# Patient Record
Sex: Male | Born: 1970 | Race: White | Hispanic: No | Marital: Married | Smoking: Never smoker
Health system: Southern US, Community
[De-identification: ages and names within clinical notes are randomized; demographics above are authoritative.]

## PROBLEM LIST (undated history)

## (undated) DIAGNOSIS — K589 Irritable bowel syndrome without diarrhea: Secondary | ICD-10-CM

## (undated) DIAGNOSIS — R519 Headache, unspecified: Secondary | ICD-10-CM

## (undated) DIAGNOSIS — R51 Headache: Secondary | ICD-10-CM

## (undated) HISTORY — PX: STOMACH SURGERY: SHX791

## (undated) HISTORY — DX: Headache: R51

## (undated) HISTORY — DX: Headache, unspecified: R51.9

## (undated) HISTORY — DX: Irritable bowel syndrome, unspecified: K58.9

---

## 2008-01-15 ENCOUNTER — Encounter: Admission: RE | Admit: 2008-01-15 | Discharge: 2008-01-15 | Payer: Self-pay | Admitting: Gastroenterology

## 2008-02-26 ENCOUNTER — Encounter: Admission: RE | Admit: 2008-02-26 | Discharge: 2008-02-26 | Payer: Self-pay | Admitting: Gastroenterology

## 2008-04-20 ENCOUNTER — Encounter: Admission: RE | Admit: 2008-04-20 | Discharge: 2008-04-20 | Payer: Self-pay | Admitting: Gastroenterology

## 2010-03-11 ENCOUNTER — Encounter: Payer: Self-pay | Admitting: Gastroenterology

## 2010-10-09 IMAGING — RF DG UGI W/ HIGH DENSITY W/KUB
19 of 24 series · 19 of 24 positions shown · non-contrast
Comparison: None

CLINICAL DATA: Epigastric pain.  Vomiting.  History of surgery for
pyloric stenosis as an infant.

UPPER GI SERIES W/HIGH DENSITY W/KUB
TECHNIQUE: After obtaining a scout radiograph, upper GI series
performed with high density barium and effervescent agent. Thin
barium also used.
Fluoroscopy Time: 4.3 minutes

[Series 1: run · 1 of 1 slices shown (1 of 19)]
[im 1/1]
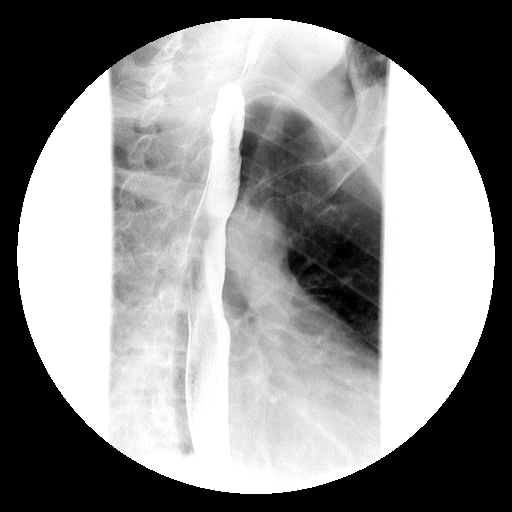

[Series 2: run · 1 of 1 slices shown (2 of 19)]
[im 1/1]
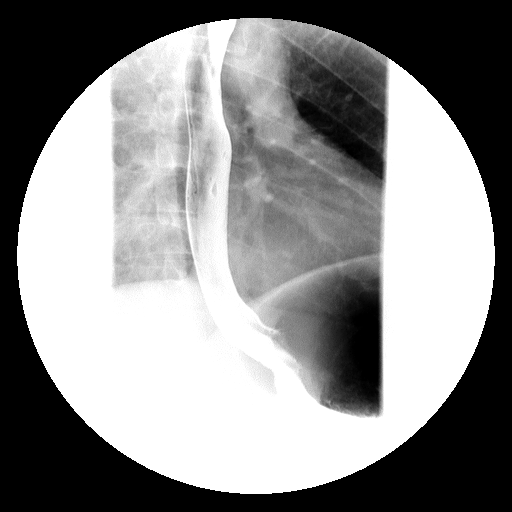

[Series 4: run · 1 of 1 slices shown (3 of 19)]
[im 1/1]
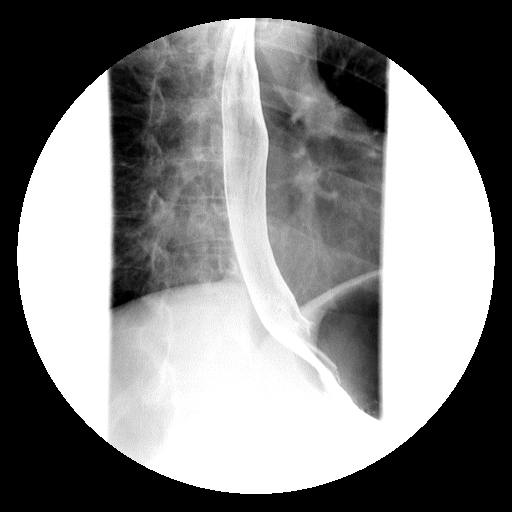

[Series 5: run · 1 of 1 slices shown (4 of 19)]
[im 1/1]
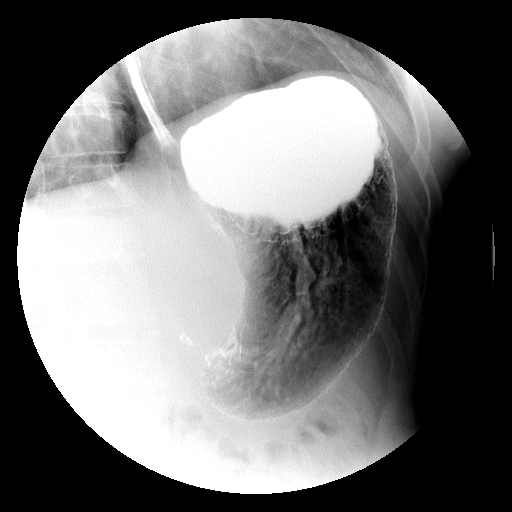

[Series 6: run · 1 of 1 slices shown (5 of 19)]
[im 1/1]
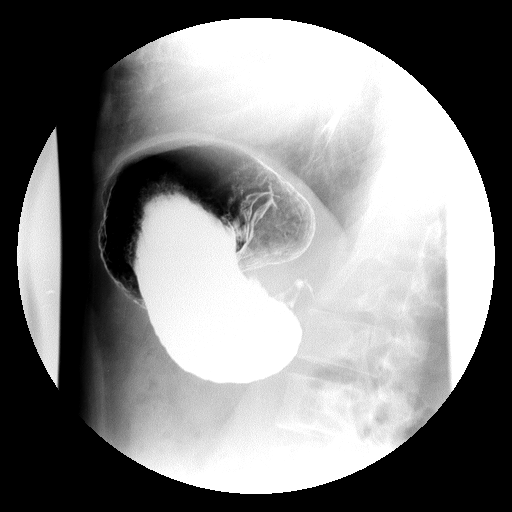

[Series 7: run · 1 of 1 slices shown (6 of 19)]
[im 1/1]
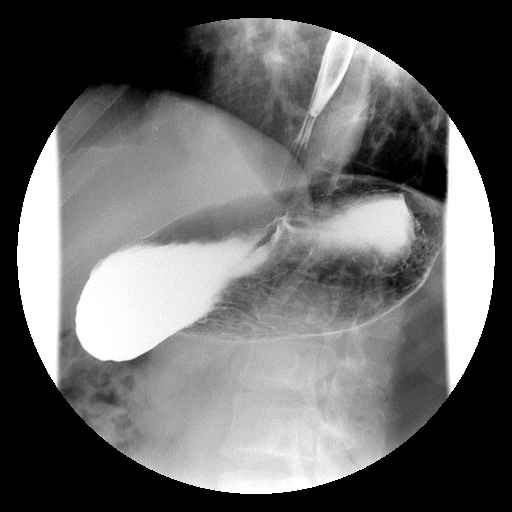

[Series 9: run · 1 of 1 slices shown (7 of 19)]
[im 1/1]
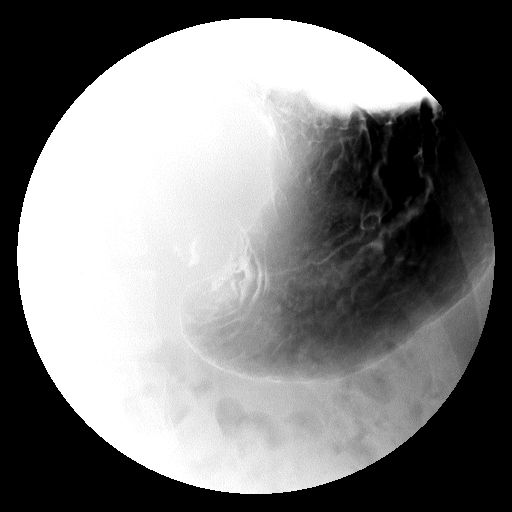

[Series 10: run · 1 of 1 slices shown (8 of 19)]
[im 1/1]
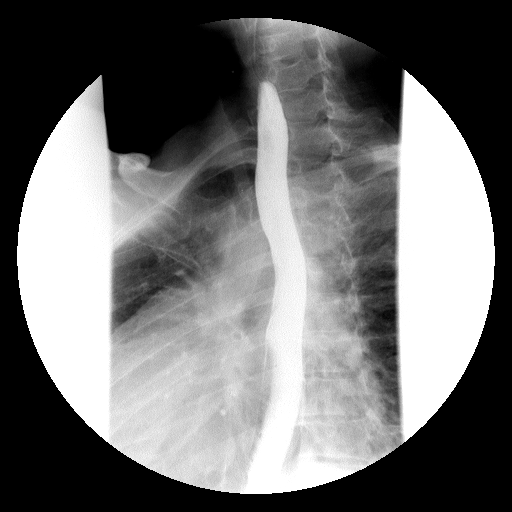

[Series 11: run · 1 of 1 slices shown (9 of 19)]
[im 1/1]
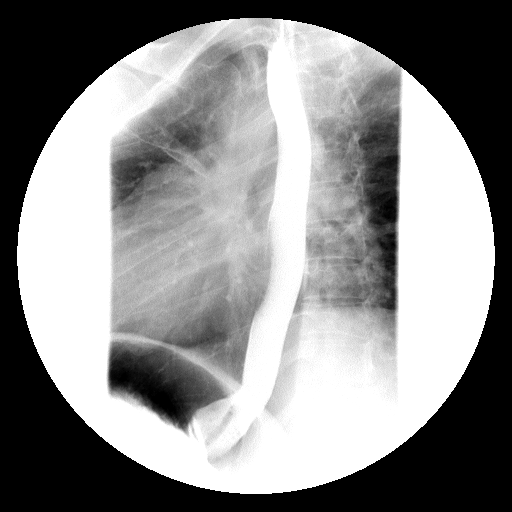

[Series 13: run · 1 of 1 slices shown (10 of 19)]
[im 1/1]
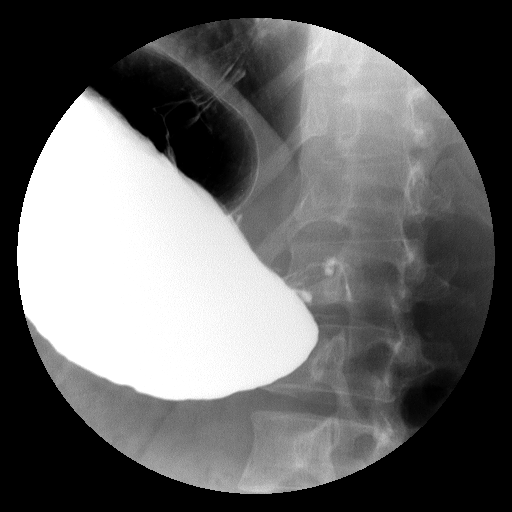

[Series 14: run · 1 of 1 slices shown (11 of 19)]
[im 1/1]
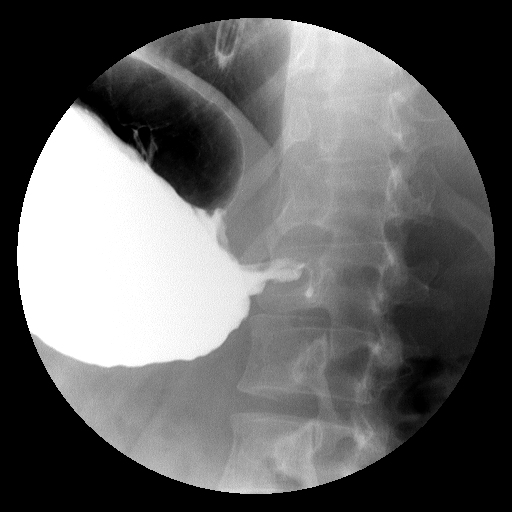

[Series 15: run · 1 of 1 slices shown (12 of 19)]
[im 1/1]
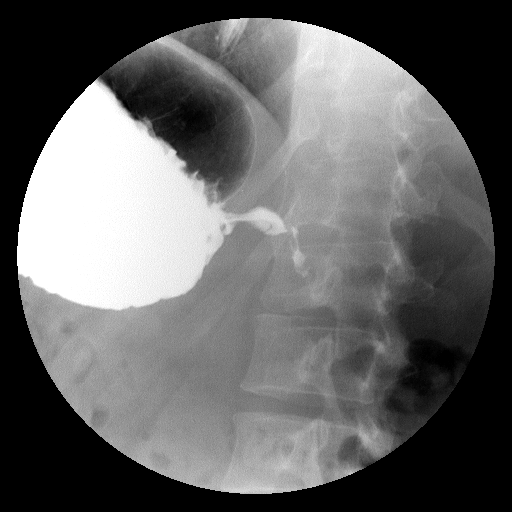

[Series 16: run · 1 of 1 slices shown (13 of 19)]
[im 1/1]
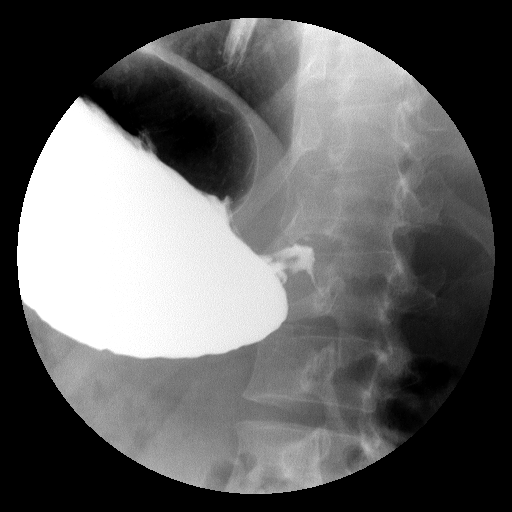

[Series 18: run · 1 of 1 slices shown (14 of 19)]
[im 1/1]
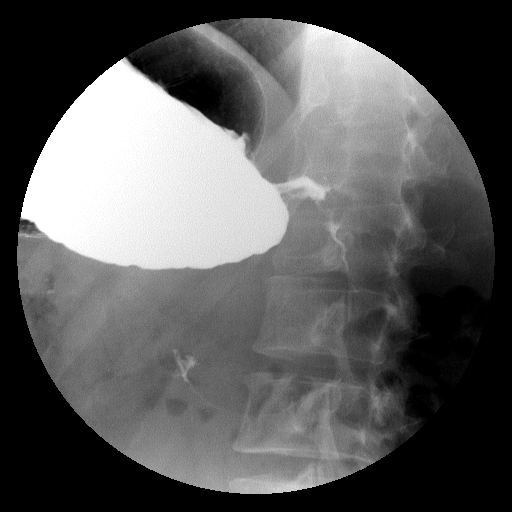

[Series 19: run · 1 of 1 slices shown (15 of 19)]
[im 1/1]
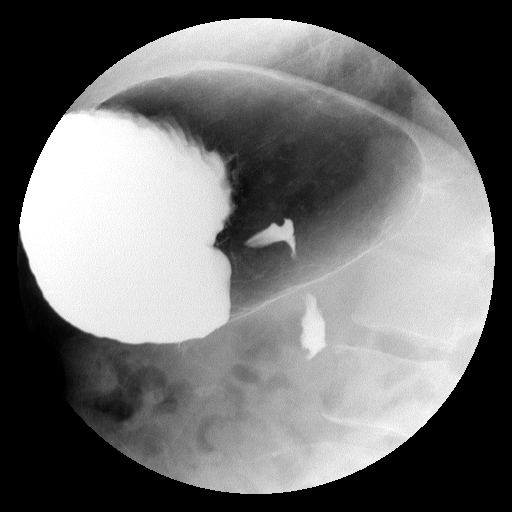

[Series 20: run · 1 of 1 slices shown (16 of 19)]
[im 1/1]
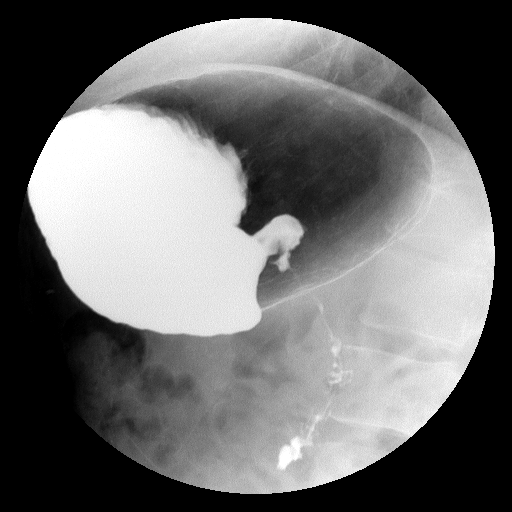

[Series 21: run · 1 of 1 slices shown (17 of 19)]
[im 1/1]
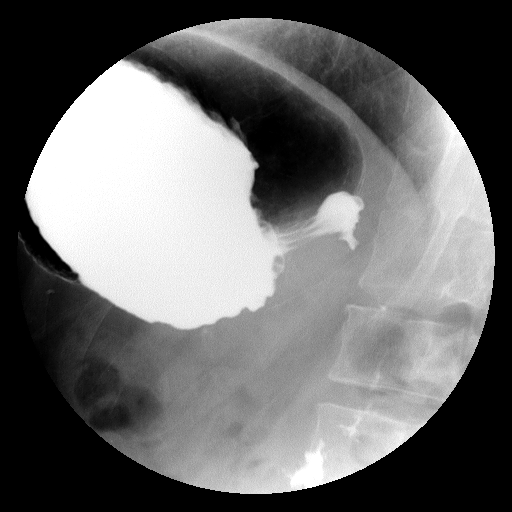

[Series 23: run · 1 of 1 slices shown (18 of 19)]
[im 1/1]
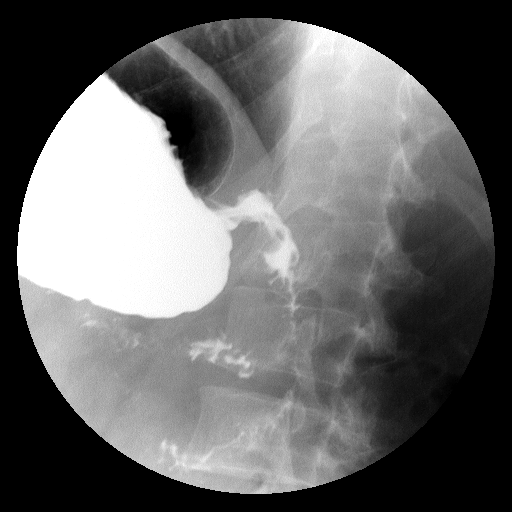

[Series 24: run · 1 of 1 slices shown (19 of 19)]
[im 1/1]
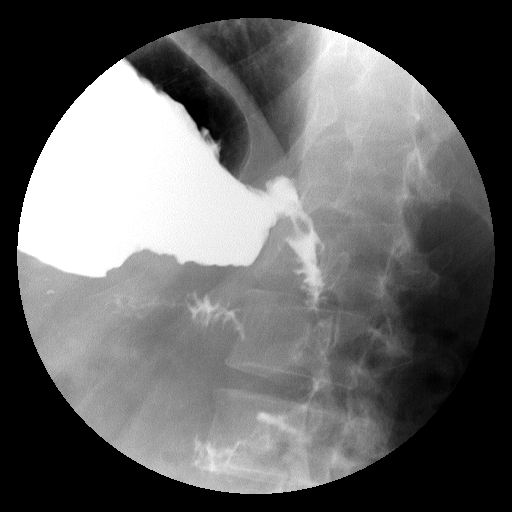

[19 of 24 positions shown; findings below may reference images not displayed]

FINDINGS: The esophagus is normal appearance.  There is no evidence
of esophageal mass or stricture.  There is no evidence of hiatal
hernia.  Esophageal motility is within normal limits.  No
gastroesophageal reflux of contrast was seen during exam.

Concentric narrowing of the distal gastric antrum is seen, with
delayed and gradual gastric emptying.  An ulcer is visualized in
the distal gastric antrum which has a large amount of surrounding
edema resulting in the narrowing of the gastric antrum.  The ulcer
has irregular margins, and malignancy cannot definitely be
excluded.

The duodenal bulb is small and abnormal in contour, but no acute
duodenal ulcers are identified.  This is suspicious for duodenitis.
The duodenal sweep is nondilated, with limited filling seen  due to
the gradual delay in gastric emptying.  However the duodenum is
normal in course and no other abnormality the duodenal sweep
identified.
IMPRESSION: 1.  Irregular ulcer the distal gastric antrum, with a large amount
of surrounding edema resulting in antral narrowing and delayed
gastric emptying.  Malignant ulcer cannot definitely be excluded;
endoscopy is recommended for further evaluation.
2.  Small irregular duodenal bulb, consistent with duodenitis.  No
discrete duodenal ulcer identified.

## 2013-03-02 ENCOUNTER — Ambulatory Visit: Payer: Self-pay | Admitting: Diagnostic Neuroimaging

## 2013-03-05 ENCOUNTER — Encounter (INDEPENDENT_AMBULATORY_CARE_PROVIDER_SITE_OTHER): Payer: Self-pay

## 2013-03-05 ENCOUNTER — Ambulatory Visit (INDEPENDENT_AMBULATORY_CARE_PROVIDER_SITE_OTHER): Payer: 59 | Admitting: Diagnostic Neuroimaging

## 2013-03-05 ENCOUNTER — Encounter: Payer: Self-pay | Admitting: Diagnostic Neuroimaging

## 2013-03-05 VITALS — BP 134/89 | HR 90 | Ht 64.0 in | Wt 147.0 lb

## 2013-03-05 DIAGNOSIS — R51 Headache: Secondary | ICD-10-CM

## 2013-03-05 DIAGNOSIS — R42 Dizziness and giddiness: Secondary | ICD-10-CM

## 2013-03-05 NOTE — Progress Notes (Signed)
[facility]  PATIENT: Luis Meyer DOB: 15-Feb-1971  REFERRING CLINICIAN: Gillermina Meyer, Luis Meyer HISTORY FROM: patient  REASON FOR VISIT: new consult   HISTORICAL  CHIEF COMPLAINT:  Chief Complaint  Patient presents with  . Dizziness     menieres disease    HISTORY OF PRESENT ILLNESS:   43-year-old right-handed male here for evaluation of dizziness.  For past one to 2 years patient is intermittent right ear fullness and pressure sensation. In April 2014 patient episode of severe spinning vertigo sensation with nausea vomiting lasting 24 hours. In December 2014 patient a second episode of this. He also has a ringing and fullness sensation in his right ear. His right ear is also sensitive to loud sounds. Patient is invalid by ENT and vestibular therapy, diagnosed with Mnire's disease. He was treated with low sodium low caffeine diet and prednisone without significant relief. Patient is concerned about alternate explanations such as ear infection, mastoiditis, vestibular schwannoma.  No problems with his left ear. No vision changes, slurred speech or trouble talking. No problems with his arms or legs.  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 He has had multiple ticks on his body over the past 2 years.  Patient's sister has also been diagnosed with Mnire's disease initially, subsequent diagnosed with right mastoiditis requiring surgery.  REVIEW OF SYSTEMS: Full 14 system review of systems performed and notable only for headache dizziness snoring restless legs anxiety discussed activities allergies diarrhea constipation snoring ringing in ear spinning sensation.  ALLERGIES: No Known Allergies  HOME MEDICATIONS: No outpatient prescriptions prior to visit.   No facility-administered medications prior to visit.    PAST MEDICAL HISTORY: Past Medical History  Diagnosis Date  . IBS (irritable bowel syndrome)     PAST SURGICAL HISTORY: Past  Surgical History  Procedure Laterality Date  . Stomach surgery      pyloric stenosis    FAMILY HISTORY: Family History  Problem Relation Age of Onset  . Heart attack Father [id number]    SOCIAL HISTORY:  History   Social History  . Marital Status: Married    Spouse Name: Luis Meyer    Number of Children: 1  . Years of Education: 12th   Occupational History  .  Other    Long Term Disability   Social History Main Topics  . Smoking status: Never Smoker   . Smokeless tobacco: Former NeurosurgeonUser     Comment: occasionally  . Alcohol Use: No  . Drug Use: No  . Sexual Activity: Not on file   Other Topics Concern  . Not on file   Social History Narrative   Patient lives at home with family.   Caffeine Use: none; quit 2 months ago     PHYSICAL EXAM  Filed Vitals:   03/05/13 0943 03/05/13 0947 03/05/13 0948  BP:  125/84 134/89  Pulse:  92 90  Height: 5\' 4"  (1.626 m)    Weight: 147 lb (66.679 kg)      Not recorded    Body mass index is 25.22 kg/(m^2).  GENERAL EXAM: Patient is in no distress; well developed, nourished and groomed; neck is supple; RESTLESS/ANXIOUS APPEARING. JITTERY. DIX HALL PIKE NEG.  CARDIOVASCULAR: Regular rate and rhythm, no murmurs, no carotid bruits  NEUROLOGIC: MENTAL STATUS: awake, alert, oriented to person, place and time, recent and remote memory intact, normal attention and concentration, language fluent, comprehension intact, naming intact, fund of knowledge appropriate CRANIAL NERVE: no papilledema on fundoscopic exam,  pupils equal and reactive to light, visual fields full to confrontation, extraocular muscles intact, no nystagmus, facial sensation and strength symmetric, hearing intact (WEBER LATERALIZES SLIGHTLY TO LEFT; RINNE RIGHT EAR BC > AC; RINNE LEFT EAR AC > BC). Palate elevates symmetrically, uvula midline, shoulder shrug symmetric, tongue midline. MOTOR: normal bulk and tone, full strength in the BUE, BLE SENSORY: normal and symmetric to  light touch, pinprick, temperature, vibration COORDINATION: finger-nose-finger, fine finger movements normal REFLEXES: deep tendon reflexes present and symmetric GAIT/STATION: narrow based gait; able to walk on toes, heels and tandem; romberg is negative   DIAGNOSTIC DATA (LABS, IMAGING, TESTING) - I reviewed patient records, labs, notes, testing and imaging myself where available.  No results found for this basename: WBC,  HGB,  HCT,  MCV,  PLT   No results found for this basename: na,  k,  cl,  co2,  glucose,  bun,  creatinine,  calcium,  prot,  albumin,  ast,  alt,  alkphos,  bilitot,  gfrnonaa,  gfraa   No results found for this basename: CHOL,  HDL,  LDLCALC,  LDLDIRECT,  TRIG,  CHOLHDL   No results found for this basename: HGBA1C   No results found for this basename: VITAMINB12   No results found for this basename: TSH      ASSESSMENT AND PLAN  43 y.o. year old male here with intermittent dizziness, vertigo, right ear tinnitus, right ear fullness since April 2014. Intermittent right ear problems since past 1-2 years.   Ddx: CNS structural (vestibular schwannoma), CNS demyelinating, vascular, post-infectious  PLAN: Orders Placed This Encounter  Procedures  . MR Brain/IAC Wo/W Cm  . Lyme, Total Ab Test/Reflex    Return in about 3 months (around 06/03/2013) for with Luis Meyer or Luis Meyer.    Luis Marker, MD 03/05/2013, 10:36 AM Certified in Neurology, Neurophysiology and Neuroimaging  [facility] [address] [place], [place] [address] [phone]

## 2013-03-08 LAB — LYME, TOTAL AB TEST/REFLEX: Lyme IgG/IgM Ab: <0.91 {ISR} (ref 0.00–0.90)

## 2013-03-11 ENCOUNTER — Ambulatory Visit (INDEPENDENT_AMBULATORY_CARE_PROVIDER_SITE_OTHER): Payer: 59

## 2013-03-11 ENCOUNTER — Other Ambulatory Visit: Payer: Self-pay | Admitting: Diagnostic Neuroimaging

## 2013-03-11 DIAGNOSIS — R51 Headache: Secondary | ICD-10-CM

## 2013-03-11 DIAGNOSIS — R42 Dizziness and giddiness: Secondary | ICD-10-CM

## 2013-03-15 ENCOUNTER — Telehealth: Payer: Self-pay | Admitting: Diagnostic Neuroimaging

## 2013-03-15 NOTE — Telephone Encounter (Signed)
NEEDS RESULTS OF MRI  °

## 2013-03-16 NOTE — Telephone Encounter (Signed)
I called and spoke to Luis Meyer and gave the results of MRI to Luis Meyer (normal).  Luis Meyer had questions about getting referral to ENT (Dr. Ermalinda BarriosEric Kraus) for his ear problems/ dizziness.  Lab for lyme negative.   Made appt with Heide GuileLynn Lam, NP to go over results address issues.  Per Dr. Marjory LiesPenumalli no need for MRI w/ contrast.

## 2013-03-18 ENCOUNTER — Encounter: Payer: Self-pay | Admitting: Nurse Practitioner

## 2013-03-18 ENCOUNTER — Ambulatory Visit (INDEPENDENT_AMBULATORY_CARE_PROVIDER_SITE_OTHER): Payer: 59 | Admitting: Nurse Practitioner

## 2013-03-18 VITALS — BP 134/91 | HR 94 | Temp 97.9°F | Ht 63.0 in | Wt 146.0 lb

## 2013-03-18 DIAGNOSIS — R51 Headache: Secondary | ICD-10-CM

## 2013-03-18 DIAGNOSIS — R42 Dizziness and giddiness: Secondary | ICD-10-CM

## 2013-03-18 DIAGNOSIS — R519 Headache, unspecified: Secondary | ICD-10-CM | POA: Insufficient documentation

## 2013-03-18 NOTE — Progress Notes (Signed)
PATIENT: Luis Meyer DOB: 02-17-1971   REASON FOR VISIT: follow up for dizziness. HISTORY FROM: patient  HISTORY OF PRESENT ILLNESS: -year-old right-handed male here for evaluation of dizziness.  For past one to 2 years patient is intermittent right ear fullness and pressure sensation. In April 2014 patient episode of severe spinning vertigo sensation with nausea vomiting lasting 24 hours. In December 2014 patient a second episode of this. He also has a ringing and fullness sensation in his right ear. His right ear is also sensitive to loud sounds. Patient was evaluated by ENT and vestibular therapy, diagnosed with Mnire's disease. He was treated with low sodium low caffeine diet and prednisone without significant relief. Patient is concerned about alternate explanations such as ear infection, mastoiditis, vestibular schwannoma.  No problems with his left ear. No vision changes, slurred speech or trouble talking. No problems with his arms or legs.  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 He has had multiple ticks on his body over the past 2 years.  Patient's sister has also been diagnosed with Mnire's disease initially, subsequent diagnosed with right mastoiditis requiring surgery.   UPDATE 03/18/13 (LL):  Patient returns for test results.  MRI with IAC protocol was normal.  Lyme negative.  Symptoms are unchanged.  He has occasional headaches that are associated with dizziness, but dizziness that is independent of headaches as well.  REVIEW OF SYSTEMS: Full 14 system review of systems performed and notable only for headache dizziness snoring restless legs anxiety discussed activities allergies diarrhea constipation snoring ringing in ear spinning sensation.  ALLERGIES: No Known Allergies  HOME MEDICATIONS: Outpatient Prescriptions Prior to Visit  Medication Sig Dispense Refill  . doxepin (SINEQUAN) 10 MG capsule Take 1 capsule by mouth at bedtime as needed  and may repeat dose one time if needed.      Marland Kitchen. Fexofenadine HCl (ALLEGRA PO) Take by mouth daily.      Marland Kitchen. esomeprazole (NEXIUM) 40 MG capsule Take 40 mg by mouth daily at 12 noon.       No facility-administered medications prior to visit.    PAST MEDICAL HISTORY: Past Medical History  Diagnosis Date  . IBS (irritable bowel syndrome)     PAST SURGICAL HISTORY: Past Surgical History  Procedure Laterality Date  . Stomach surgery      pyloric stenosis    FAMILY HISTORY: Family History  Problem Relation Age of Onset  . Heart attack Father [id number]    SOCIAL HISTORY: History   Social History  . Marital Status: Married    Spouse Name: [name]    Number of Children: 1  . Years of Education: 12th   Occupational History  .  Other    Long Term Disability   Social History Main Topics  . Smoking status: Never Smoker   . Smokeless tobacco: Former NeurosurgeonUser     Comment: occasionally  . Alcohol Use: No  . Drug Use: No  . Sexual Activity: Not on file   Other Topics Concern  . Not on file   Social History Narrative   Patient lives at home with family.   Caffeine Use: none; quit 2 months ago     PHYSICAL EXAM  Filed Vitals:   03/18/13 1040 03/18/13 1043  BP: 135/93 134/91  Pulse: 99 94  Temp: 97.9 F (36.6 C)   TempSrc: Oral   Height: 5\' 3"  (1.6 m)   Weight: 146 lb (66.225 kg)    Body  mass index is 25.87 kg/(m^2).  GENERAL EXAM:  Patient is in no distress; well developed, nourished and groomed; neck is supple; RESTLESS/ANXIOUS APPEARING. JITTERY. DIX HALL PIKE NEG.  CARDIOVASCULAR:  Regular rate and rhythm, no murmurs, no carotid bruits  NEUROLOGIC:  MENTAL STATUS: awake, alert, oriented to person, place and time, recent and remote memory intact, normal attention and concentration, language fluent, comprehension intact, naming intact, fund of knowledge appropriate  CRANIAL NERVE:  pupils equal and reactive to light, visual fields full to confrontation, extraocular muscles  intact, no nystagmus, facial sensation and strength symmetric, hearing intact (WEBER LATERALIZES SLIGHTLY TO LEFT; RINNE RIGHT EAR BC > AC; RINNE LEFT EAR AC > BC). Palate elevates symmetrically, uvula midline, shoulder shrug symmetric, tongue midline.  MOTOR: normal bulk and tone, full strength in the BUE, BLE  SENSORY: normal and symmetric to light touch COORDINATION: finger-nose-finger, fine finger movements normal  REFLEXES: deep tendon reflexes present and symmetric  GAIT/STATION: narrow based gait; able to walk on toes, heels and tandem; romberg is negative  Lab for lyme negative 03/11/13 - Normal MRI brain (without).  ASSESSMENT AND PLAN  43 y.o. year old male here with intermittent dizziness, vertigo, right ear tinnitus, right ear fullness since April 2014. Intermittent right ear problems since past 1-2 years with normal MRI brain with AIC protocol.   Ddx: vascular, post-infectious, migraine associated vertigo  PLAN: - continue meclizine as needed for dizziness. - referral to Dr. Maximino Sarin, ENT for 2nd opinion. - follow up in our office as needed.  Orders Placed This Encounter  Procedures  . Ambulatory referral to ENT   Return if symptoms worsen or fail to improve.  Luis Fear, MSN, NP-C 03/18/2013, 11:23 AM [facility] [address], [address] [place], [place] [address] [phone]  Note: This document was prepared with digital dictation and possible smart phrase technology. Any transcriptional errors that result from this process are unintentional.

## 2013-03-18 NOTE — Patient Instructions (Addendum)
We are putting in a referral to Dr. Maximino SarinEric Krause, ENT.  Someone will call you to schedule an appointment.  Continue Meclizine for dizziness.  Follow up as scheduled, sooner as needed.

## 2013-06-04 ENCOUNTER — Telehealth: Payer: Self-pay | Admitting: Nurse Practitioner

## 2013-06-04 ENCOUNTER — Ambulatory Visit: Payer: 59 | Admitting: Nurse Practitioner

## 2013-06-04 NOTE — Telephone Encounter (Signed)
Patient was no show for today's office appointment.  

## 2013-07-21 ENCOUNTER — Other Ambulatory Visit: Payer: Self-pay | Admitting: Neurology

## 2013-07-21 DIAGNOSIS — H9311 Tinnitus, right ear: Secondary | ICD-10-CM

## 2013-07-21 DIAGNOSIS — R42 Dizziness and giddiness: Secondary | ICD-10-CM

## 2013-07-21 DIAGNOSIS — R519 Headache, unspecified: Secondary | ICD-10-CM

## 2013-07-21 DIAGNOSIS — R51 Headache: Secondary | ICD-10-CM

## 2013-07-30 ENCOUNTER — Ambulatory Visit
Admission: RE | Admit: 2013-07-30 | Discharge: 2013-07-30 | Disposition: A | Discharge status: Home / Self Care | Payer: 59 | Source: Ambulatory Visit | Attending: Neurology | Admitting: Neurology

## 2013-07-30 DIAGNOSIS — H9311 Tinnitus, right ear: Secondary | ICD-10-CM

## 2013-07-30 DIAGNOSIS — R519 Headache, unspecified: Secondary | ICD-10-CM

## 2013-07-30 DIAGNOSIS — R51 Headache: Secondary | ICD-10-CM

## 2013-07-30 DIAGNOSIS — R42 Dizziness and giddiness: Secondary | ICD-10-CM

## 2013-07-30 MED ORDER — GADOBENATE DIMEGLUMINE 529 MG/ML IV SOLN
13.0000 mL | Freq: Once | INTRAVENOUS | Status: AC | PRN
  Administered 2013-07-30: 13 mL via INTRAVENOUS

## 2014-04-06 ENCOUNTER — Encounter: Payer: Self-pay | Admitting: Neurology

## 2014-04-06 ENCOUNTER — Ambulatory Visit (INDEPENDENT_AMBULATORY_CARE_PROVIDER_SITE_OTHER): Payer: BLUE CROSS/BLUE SHIELD | Admitting: Neurology

## 2014-04-06 ENCOUNTER — Telehealth: Payer: Self-pay | Admitting: Neurology

## 2014-04-06 ENCOUNTER — Ambulatory Visit (HOSPITAL_COMMUNITY)
Admission: RE | Admit: 2014-04-06 | Discharge: 2014-04-06 | Disposition: A | Discharge status: Home / Self Care | Payer: BLUE CROSS/BLUE SHIELD | Source: Ambulatory Visit | Attending: Neurology | Admitting: Neurology

## 2014-04-06 VITALS — BP 126/88 | HR 78 | Temp 97.6°F | Resp 18 | Ht 63.0 in | Wt 149.4 lb

## 2014-04-06 DIAGNOSIS — R42 Dizziness and giddiness: Secondary | ICD-10-CM

## 2014-04-06 DIAGNOSIS — R51 Headache: Secondary | ICD-10-CM | POA: Diagnosis not present

## 2014-04-06 DIAGNOSIS — R519 Headache, unspecified: Secondary | ICD-10-CM

## 2014-04-06 NOTE — Telephone Encounter (Signed)
Pt states that he would like the test result please call [phone]

## 2014-04-06 NOTE — Patient Instructions (Addendum)
You don't have Meniere's.  I think we should try to treat it as a migraine and see how you do. 1.  We will get an EKG today.  Once I look at it, I will prescribe you verapamil 80mg  three times daily.  It is a blood pressure medication often used for certain types of headaches or migraines. 2.  Limit use of pain relievers to no more than 2 days out of the week (Try Advil or Excedrin Migraine) 3.  Call in 4 weeks with update and we can adjust dose if needed. 4.  Follow up in 3 months.  [employer]   [address] .

## 2014-04-06 NOTE — Progress Notes (Signed)
NEUROLOGY CONSULTATION NOTE  Luis Meyer MRN: [id number] DOB: 02/14/1971  Referring provider: Ermalinda Barrios, MD Primary care provider: Isac Caddy, FNP  Reason for consult:  Vertigo, headache  HISTORY OF PRESENT ILLNESS: Luis Meyer is a 44 year old right-handed man who presents for second opinion regarding migraine.  Records, labs and MRI of brain reviewed.  In April 2014, he started having episode of dizziness, described as sense of movement but not spinning.  It was associated with nausea and vomiting.  It was also associated with aural fullness and ringing in the right ear.  This lasted approximately 24 hours.  He had a second episode that December.  Over the course of the next year and a half, he began having more frequent headaches.  He describes it as right-sided posterior.  It is a pressure like pain that goes behind the right ear.  It is about 7/10.  It is associated with photophobia, phonophobia and often preceded by tinnitus in the right ear.  Sound is muffled but he does not appreciate any real noticeable hearing loss.  There is no associated visual disturbance or focal numbness or weakness.  He gets them about 3 days per week.  He takes Advil.  Tylenol and Aleve were ineffective.  He was evaluated by ENT and was diagnosed with Meniere's disease.  Treatment, including low-sodium diet and prednisone, were not effective.  He was then evaluated by neurology in January 2015.  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  There was a concern for tick bites, so he was tested for Lyme, which was negative.  He was evaluated by another ENT, Dr. Dorma Russell.  ENG from 07/09/13 was overall unremarkable except for showing borderline abnormal optokinetic testing, which may suggest central involvement.  ABR reportedly was abnormal with "absence of wave I AD and wave I-III AS with condensation  polarity".  Audiometric testing revealed only mild bilateral high-frequency sensorineural hearing loss.  Meniere's was ruled out, so he was referred to Dr. Clarisse Gouge, a headache specialist.  MRI of the brain with and without contrast again showed the benign venous anomaly, but nothing concerning.  MRA of the head showed fetal origin of the left PCA, but no vertebrobasilar insufficiency.  Dr. Clarisse Gouge did not suspect migraine, however.  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  His sister has Meniere's disease.  His mother may have had migraines.  His father had a heart attack at age 31.  PAST MEDICAL HISTORY: Past Medical History  Diagnosis Date  . IBS (irritable bowel syndrome)   . Headache     PAST SURGICAL HISTORY: Past Surgical History  Procedure Laterality Date  . Stomach surgery      pyloric stenosis    MEDICATIONS: Current Outpatient Prescriptions on File Prior to Visit  Medication Sig Dispense Refill  . DEXILANT 60 MG capsule Take 1 capsule by mouth daily.    Marland Kitchen Fexofenadine HCl (ALLEGRA PO) Take by mouth daily.     No current facility-administered medications on file prior to visit.    ALLERGIES: No Known Allergies  FAMILY HISTORY: Family History  Problem Relation Age of Onset  . Heart attack Father 57  . Heart failure Paternal Grandmother     SOCIAL HISTORY: History   Social History  .  Marital Status: Married    Spouse Name: Olegario Messier  . Number of Children: 1  . Years of Education: 12th   Occupational History  .  Other    Long Term Disability   Social History Main Topics  . Smoking status: Never Smoker   . Smokeless tobacco: Current User     Comment: occasionally  . Alcohol Use: 0.0 oz/week    0 Standard drinks or equivalent per week     Comment: beer 3 or more weekly  . Drug Use: No  . Sexual Activity:    Partners: Female   Other Topics Concern  . Not  on file   Social History Narrative   Patient lives at home with family.   Caffeine Use: none; quit 2 months ago    REVIEW OF SYSTEMS: Constitutional: No fevers, chills, or sweats, no generalized fatigue, change in appetite Eyes: No visual changes, double vision, eye pain Ear, nose and throat: No hearing loss, ear pain, nasal congestion, sore throat Cardiovascular: No chest pain, palpitations Respiratory:  No shortness of breath at rest or with exertion, wheezes GastrointestinaI: No nausea, vomiting, diarrhea, abdominal pain, fecal incontinence Genitourinary:  No dysuria, urinary retention or frequency Musculoskeletal:  No neck pain, back pain Integumentary: No rash, pruritus, skin lesions Neurological: as above Psychiatric: No depression, insomnia, anxiety Endocrine: No palpitations, fatigue, diaphoresis, mood swings, change in appetite, change in weight, increased thirst Hematologic/Lymphatic:  No anemia, purpura, petechiae. Allergic/Immunologic: no itchy/runny eyes, nasal congestion, recent allergic reactions, rashes  PHYSICAL EXAM: Filed Vitals:   04/06/14 0828  BP: 126/88  Pulse: 78  Temp: 97.6 F (36.4 C)  Resp: 18   General: No acute distress Head:  Normocephalic/atraumatic Eyes:  fundi unremarkable, without vessel changes, exudates, hemorrhages or papilledema. Neck: supple, no paraspinal tenderness, full range of motion Back: No paraspinal tenderness Heart: regular rate and rhythm Lungs: Clear to auscultation bilaterally. Vascular: No carotid bruits. Neurological Exam: Mental status: alert and oriented to person, place, and time, recent and remote memory intact, fund of knowledge intact, attention and concentration intact, speech fluent and not dysarthric, language intact. Cranial nerves: CN I: not tested CN II: pupils equal, round and reactive to light, visual fields intact, fundi unremarkable, without vessel changes, exudates, hemorrhages or papilledema. CN III,  IV, VI:  full range of motion, no nystagmus, no ptosis CN V: facial sensation intact CN VII: upper and lower face symmetric CN VIII: hearing intact CN IX, X: gag intact, uvula midline CN XI: sternocleidomastoid and trapezius muscles intact CN XII: tongue midline Bulk & Tone: normal, no fasciculations. Motor:  5/5 throughout Sensation:  Temperature and vibration intact Deep Tendon Reflexes:  2+ throughout, toes downgoing. Finger to nose testing:  No dysmetria Heel to shin:  No dysmetria Gait:  Normal station and stride.  Able to turn and walk in tandem. Romberg negative.  IMPRESSION: Intermittent episodes of headache and dizziness.  Audiometric testing does not correlate with Meniere's.  I think given the duration of attacks and association with headache, vestibular migraines need to be considered.  PLAN: 1.  Sometimes a calcium channel blocker may be helpful for posterior circulation migraines.  We will first check EKG.  If QT interval is okay, will initiate verapamil  three times daily 2.  For abortive therapy, continue Advil or try Excedrin Migraine.  Limit to no more than 2 days out of the week.  Continue to remain off caffeine. 3.  Call in 4 weeks with update.  Follow up in 3  months.  Thank you for allowing me to take part in the care of this patient.  Shon MilletAdam Jaffe, DO  CC:  Ermalinda BarriosEric Kraus, MD  Gillermina Huracy Thomas, FNP

## 2014-04-07 NOTE — Telephone Encounter (Signed)
Called patient to let him know I do not have the final results to his EKG yet .

## 2014-04-08 ENCOUNTER — Telehealth: Payer: Self-pay | Admitting: Neurology

## 2014-04-08 ENCOUNTER — Other Ambulatory Visit: Payer: Self-pay | Admitting: *Deleted

## 2014-04-08 DIAGNOSIS — G43009 Migraine without aura, not intractable, without status migrainosus: Secondary | ICD-10-CM

## 2014-04-08 MED ORDER — VERAPAMIL HCL 80 MG PO TABS: 80.0000 mg | ORAL_TABLET | Freq: Three times a day (TID) | ORAL | Status: DC

## 2014-04-08 NOTE — Telephone Encounter (Signed)
Pt called wanting to speak to a nurse regarding the high blood pressure medicine that Luis Meyer was going to Rx him after getting his results of his EKG. Pt stated it was also for his headaches. C/b [phone]

## 2014-04-08 NOTE — Telephone Encounter (Signed)
Patient is aware that EKG is normal and verapamil  80 mg 1 po 3 times a day # 90 with 2 refills called to pharmacy

## 2014-04-20 ENCOUNTER — Telehealth: Payer: Self-pay | Admitting: Neurology

## 2014-04-20 NOTE — Telephone Encounter (Signed)
Patient states he is having tinnitus since he starting taking the verapamil 80mg  please advise

## 2014-04-20 NOTE — Telephone Encounter (Signed)
Tinnitus is not a typical side effect of verapamil.  Actually, it has sometimes been used to treat tinnitus.  Is this really different than the tinnitus he has with the dizziness?  The only way to tell if it is the verapamil is to stop the verapamil and see how he does for the next week.  If it persists, then it is likely not the verapamil.  If it resolves, then we can try something else.

## 2014-04-20 NOTE — Telephone Encounter (Signed)
Pt called requesting to speak to a nurse regarding having possible side effects from his meds. C/b [phone]

## 2014-04-27 ENCOUNTER — Telehealth: Payer: Self-pay | Admitting: Neurology

## 2014-04-27 NOTE — Telephone Encounter (Signed)
Pt called wanting to speak to a nurse regarding pt's meds. C/b [phone]

## 2014-04-27 NOTE — Telephone Encounter (Signed)
Returned patient's call did not leave message

## 2014-04-28 ENCOUNTER — Telehealth: Payer: Self-pay | Admitting: *Deleted

## 2014-04-28 ENCOUNTER — Other Ambulatory Visit: Payer: Self-pay | Admitting: *Deleted

## 2014-04-28 DIAGNOSIS — G43009 Migraine without aura, not intractable, without status migrainosus: Secondary | ICD-10-CM

## 2014-04-28 MED ORDER — TOPIRAMATE 25 MG PO TABS: 25.0000 mg | ORAL_TABLET | Freq: Every day | ORAL | Status: DC

## 2014-04-28 NOTE — Telephone Encounter (Signed)
Patient returning your call  C/B [phone]

## 2014-04-28 NOTE — Telephone Encounter (Signed)
We can try topiramate 25mg  at bedtime. Possible side effects include: impaired thinking, sedation, paresthesias (numbness and tingling) and weight loss.  It may cause dehydration and there is a small risk for kidney stones, so make sure to stay hydrated with water during the day.  There is also a very small risk for glaucoma, so if you notice any change in your vision while taking this medication, see an ophthalmologist.  He should call in 4 weeks with update.

## 2014-04-28 NOTE — Telephone Encounter (Signed)
Patient returning your call  C/B [phone]  

## 2014-04-28 NOTE — Telephone Encounter (Signed)
I spoke with patient he stopped the verapamil however he is still having ringing in the ears and dizziness along with headache . He also states his anxiety is up some I advised him to contact his PCP and discuss the anxiety with him since he is prescribing medication for this . He ask does he need to be taking something else for his headaches please advise

## 2014-04-28 NOTE — Telephone Encounter (Signed)
Patient is aware of new medication and side effect that can happen this was sent to pharmacy  E scribe I also left a voice message for patient

## 2014-06-06 ENCOUNTER — Telehealth: Payer: Self-pay | Admitting: Neurology

## 2014-06-06 ENCOUNTER — Other Ambulatory Visit: Payer: Self-pay | Admitting: *Deleted

## 2014-06-06 DIAGNOSIS — G43009 Migraine without aura, not intractable, without status migrainosus: Secondary | ICD-10-CM

## 2014-06-06 MED ORDER — VERAPAMIL HCL 80 MG PO TABS: 80.0000 mg | ORAL_TABLET | Freq: Three times a day (TID) | ORAL | Status: DC

## 2014-06-06 NOTE — Telephone Encounter (Signed)
Rx was sent to pharmacy for  Verapamil 80 mg #90  1 po tid with 2 refills

## 2014-06-06 NOTE — Telephone Encounter (Signed)
Then we can refill verapamil 80mg  three times daily

## 2014-06-06 NOTE — Telephone Encounter (Signed)
Patient states No he did not start the Topamax . After stopping the verapamil the ringing in the ear did not stop so he went back on the verapamil and the dizziness seems to be better to some degree.

## 2014-06-06 NOTE — Telephone Encounter (Signed)
Pt wants to have a refill on the verapamil called into the [facility]  in [place] pt phone number is [phone]

## 2014-06-06 NOTE — Telephone Encounter (Signed)
Susie, can you check into this?  Mr. Luis Meyer told us he had stopped verapamil due to ringing in his ears.  Instead, we started him on topamax.

## 2014-07-05 ENCOUNTER — Encounter: Payer: Self-pay | Admitting: Neurology

## 2014-07-05 ENCOUNTER — Ambulatory Visit (INDEPENDENT_AMBULATORY_CARE_PROVIDER_SITE_OTHER): Payer: BLUE CROSS/BLUE SHIELD | Admitting: Neurology

## 2014-07-05 VITALS — BP 106/62 | HR 84 | Ht 63.0 in | Wt 159.0 lb

## 2014-07-05 DIAGNOSIS — R42 Dizziness and giddiness: Secondary | ICD-10-CM

## 2014-07-05 DIAGNOSIS — H9311 Tinnitus, right ear: Secondary | ICD-10-CM | POA: Diagnosis not present

## 2014-07-05 DIAGNOSIS — R519 Headache, unspecified: Secondary | ICD-10-CM

## 2014-07-05 DIAGNOSIS — R51 Headache: Secondary | ICD-10-CM | POA: Diagnosis not present

## 2014-07-05 DIAGNOSIS — H9319 Tinnitus, unspecified ear: Secondary | ICD-10-CM | POA: Insufficient documentation

## 2014-07-05 NOTE — Patient Instructions (Signed)
Continue verapamil 80mg  three times daily Repeat EKG in 5 months and follow up soon after.

## 2014-07-05 NOTE — Progress Notes (Signed)
NEUROLOGY FOLLOW UP OFFICE NOTE  Luis Meyer [id number]  HISTORY OF PRESENT ILLNESS: Luis Meyer is a 44 year old right-handed man who follows up for recurrent episodes of headache and dizziness.  EKG reviewed.  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  EKG showed normal sinus rhythm of 79 bpm with PR interval of 164 ms and QT/QTc of 374/428.  It was stopped due to tinnitus.  After discontinuing the verapamil, the tinnitus persisted, so verapamil was restarted.  He reported increased anxiety.  He reports the headaches have resolved.  He feels a little off balance every now and then but he has not had any severe episodes of vertigo.  The tinnitus is still an issue.  HISTORY: In April 2014, he started having episode of dizziness, described as sense of movement but not spinning.  It was associated with nausea and vomiting.  It was also associated with aural fullness and ringing in the right ear.  This lasted approximately 24 hours.  He had a second episode that December.  Over the course of the next year and a half, he began having more frequent headaches.  He describes it as right-sided posterior.  It is a pressure like pain that goes behind the right ear.  It is about 7/10.  It is associated with photophobia, phonophobia and often preceded by tinnitus in the right ear.  Sound is muffled but he does not appreciate any real noticeable hearing loss.  There is no associated visual disturbance or focal numbness or weakness.  He gets them about 3 days per week.  He takes Advil.  Tylenol and Aleve were ineffective.  He was evaluated by ENT and was diagnosed with Meniere's disease.  Treatment, including low-sodium diet and prednisone, were not effective.  He was then evaluated by neurology in January 2015.  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  There was a concern for tick bites, so he was tested for Lyme, which was negative.  He was evaluated by another ENT, Dr. Dorma RussellKraus.  ENG from 07/09/13 was overall unremarkable except for showing borderline abnormal optokinetic testing, which may suggest central involvement.  ABR reportedly was abnormal with "absence of wave I AD and wave I-III AS with condensation polarity".  Audiometric testing revealed only mild bilateral high-frequency sensorineural hearing loss.  Meniere's was ruled out, so he was referred to Dr. Clarisse GougeLewit, a headache specialist.  MRI of the brain with and without contrast again showed the benign venous anomaly, but nothing concerning.  MRA of the head showed fetal origin of the left PCA, but no vertebrobasilar insufficiency.  Dr. Clarisse GougeLewit did not suspect migraine, however.  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  His sister has Meniere's disease.  His mother may have had migraines.  His father had a heart attack at age 42.  PAST MEDICAL HISTORY: Past Medical History  Diagnosis Date  . IBS (irritable bowel syndrome)   . Headache     MEDICATIONS: Current Outpatient Prescriptions on File Prior to Visit  Medication Sig Dispense Refill  . Fexofenadine HCl (ALLEGRA PO) Take by mouth daily.    . verapamil (CALAN) 80 MG tablet Take 1 tablet (80 mg total) by mouth 3 (three) times daily. 90  tablet 2   No current facility-administered medications on file prior to visit.    ALLERGIES: No Known Allergies  FAMILY HISTORY: Family History  Problem Relation Age of Onset  . Heart attack Father [id number]  . Heart failure Paternal Grandmother     SOCIAL HISTORY: History   Social History  . Marital Status: Married    Spouse Name: [name]  . Number of Children: 1  . Years of Education: 12th   Occupational History  .  Other    Long Term Disability   Social History Main Topics  .  Smoking status: Never Smoker   . Smokeless tobacco: Current User     Comment: occasionally  . Alcohol Use: 0.0 oz/week    0 Standard drinks or equivalent per week     Comment: beer 3 or more weekly  . Drug Use: No  . Sexual Activity:    Partners: Female   Other Topics Concern  . Not on file   Social History Narrative   Patient lives at home with family.   Caffeine Use: none; quit 2 months ago    REVIEW OF SYSTEMS: Constitutional: No fevers, chills, or sweats, no generalized fatigue, change in appetite Eyes: No visual changes, double vision, eye pain Ear, nose and throat: No hearing loss, ear pain, nasal congestion, sore throat Cardiovascular: No chest pain, palpitations Respiratory:  No shortness of breath at rest or with exertion, wheezes GastrointestinaI: No nausea, vomiting, diarrhea, abdominal pain, fecal incontinence Genitourinary:  No dysuria, urinary retention or frequency Musculoskeletal:  No neck pain, back pain Integumentary: No rash, pruritus, skin lesions Neurological: as above Psychiatric: No depression, insomnia, anxiety Endocrine: No palpitations, fatigue, diaphoresis, mood swings, change in appetite, change in weight, increased thirst Hematologic/Lymphatic:  No anemia, purpura, petechiae. Allergic/Immunologic: no itchy/runny eyes, nasal congestion, recent allergic reactions, rashes  PHYSICAL EXAM: Filed Vitals:   07/05/14 0736  BP: 106/62  Pulse: 84   General: No acute distress Head:  Normocephalic/atraumatic Eyes:  Fundoscopic exam unremarkable without vessel changes, exudates, hemorrhages or papilledema. Neck: supple, no paraspinal tenderness, full range of motion Heart:  Regular rate and rhythm Lungs:  Clear to auscultation bilaterally Back: No paraspinal tenderness Neurological Exam: alert and oriented to person, place, and time. Attention span and concentration intact, recent and remote memory intact, fund of knowledge intact.  Speech fluent and not  dysarthric, language intact.  CN II-XII intact. Fundoscopic exam unremarkable without vessel changes, exudates, hemorrhages or papilledema.  Bulk and tone normal, muscle strength 5/5 throughout.  Sensation to light touch, temperature and vibration intact.  Deep tendon reflexes 2+ throughout, toes downgoing.  Finger to nose and heel to shin testing intact.  Gait normal, Romberg negative.  IMPRESSION: Recurrent episodes of headache and dizziness.  Differential diagnosis includes vestibular migraine.  They are improved Right sided tinnitus.  Unfortunately, I have no specific treatment for this.  I suggested cognitive behavioral therapy, which he declined at this time  PLAN: 1.  Continue verapamil 80mg  three times daily 2.  Repeat EKG in 5 months with follow up soon after.  15 minutes spent with patient, over 50% spent discussing diagnoses and management.  Shon MilletAdam Cordell Coke, DO  CC:  Joycelyn RuaStephen Meyers, MD  Gillermina Huracy Thomas, FNP

## 2014-07-22 ENCOUNTER — Telehealth: Payer: Self-pay | Admitting: Neurology

## 2014-07-22 NOTE — Telephone Encounter (Signed)
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 Patient states that he has not had a vertigo episode since December 2014. The form he picked up did not have enough information this is due by the 16 of June but he would like to get this ASAP

## 2014-07-22 NOTE — Telephone Encounter (Signed)
Pt picked up some paper work today and would like to talk to some one today about it please call [phone]

## 2014-07-22 NOTE — Telephone Encounter (Signed)
See previous documentation. Looks like this is in regards to a letter sent by Dr. Everlena Cooperjaffe regarding his driving, please call patient for additional details. CB# [phone] / Sherri S.

## 2014-07-24 ENCOUNTER — Encounter: Payer: Self-pay | Admitting: Neurology

## 2014-07-24 NOTE — Telephone Encounter (Signed)
Letter with updated info prepared.

## 2014-07-25 ENCOUNTER — Encounter: Payer: Self-pay | Admitting: *Deleted

## 2014-07-25 NOTE — Telephone Encounter (Signed)
Patient notified and letter up front ready for pick up

## 2014-07-27 ENCOUNTER — Ambulatory Visit (INDEPENDENT_AMBULATORY_CARE_PROVIDER_SITE_OTHER): Payer: BLUE CROSS/BLUE SHIELD | Admitting: Neurology

## 2014-07-27 ENCOUNTER — Telehealth: Payer: Self-pay | Admitting: Neurology

## 2014-07-27 ENCOUNTER — Encounter: Payer: Self-pay | Admitting: Neurology

## 2014-07-27 VITALS — BP 118/70 | HR 80 | Ht 63.0 in | Wt 152.9 lb

## 2014-07-27 DIAGNOSIS — F411 Generalized anxiety disorder: Secondary | ICD-10-CM | POA: Diagnosis not present

## 2014-07-27 DIAGNOSIS — H9311 Tinnitus, right ear: Secondary | ICD-10-CM

## 2014-07-27 DIAGNOSIS — R42 Dizziness and giddiness: Secondary | ICD-10-CM

## 2014-07-27 DIAGNOSIS — R51 Headache: Secondary | ICD-10-CM | POA: Diagnosis not present

## 2014-07-27 DIAGNOSIS — R519 Headache, unspecified: Secondary | ICD-10-CM

## 2014-07-27 MED ORDER — AMITRIPTYLINE HCL 25 MG PO TABS: 25.0000 mg | ORAL_TABLET | Freq: Every day | ORAL | Status: AC

## 2014-07-27 MED ORDER — NORTRIPTYLINE HCL 25 MG PO CAPS: 25.0000 mg | ORAL_CAPSULE | Freq: Every day | ORAL | Status: AC

## 2014-07-27 NOTE — Telephone Encounter (Signed)
[name] not[facility] of [facility] sent to the pharmacy. Patient advised to call back with any questions

## 2014-07-27 NOTE — Patient Instructions (Signed)
We will try nortriptyline 25mg  at bedtime.  Call in 4 weeks with update Stop verapamil Follow up in 2 months.

## 2014-07-27 NOTE — Telephone Encounter (Signed)
Please advise 

## 2014-07-27 NOTE — Telephone Encounter (Signed)
Order for amitriptyline 25mg  at bedtime instead of nortriptyline

## 2014-07-27 NOTE — Progress Notes (Signed)
NEUROLOGY FOLLOW UP OFFICE NOTE  AVEON COLQUHOUN [id number]  HISTORY OF PRESENT ILLNESS: Luis Meyer is a 44 year old right-handed man who follows up for recurrent episodes of headache and dizziness.    UPDATE: He is taking verapamil  three times daily.  He had side effects with topiramate.  He has not been feeling well.  He doesn't have the distinct episodes of vertigo anymore.  He now has a constant feeling of dizziness (not spinning), heaviness in the head, aura fullness and tinnitus.  He gets a right sided headache with ear pain about once a week.  He feels off-balance.  He also exhibits anxiety.  He takes dramamine or meclizine.  HISTORY: In April 2014, he started having episode of dizziness, described as sense of movement but not spinning.  It was associated with nausea and vomiting.  It was also associated with aural fullness and ringing in the right ear.  This lasted approximately 24 hours.  He had a second episode that December.  Over the course of the next year and a half, he began having more frequent headaches.  He describes it as right-sided posterior.  It is a pressure like pain that goes behind the right ear.  It is about 7/10.  It is associated with photophobia, phonophobia and often preceded by tinnitus in the right ear.  Sound is muffled but he does not appreciate any real noticeable hearing loss.  There is no associated visual disturbance or focal numbness or weakness.  He gets them about 3 days per week.  He takes Advil.  Tylenol and Aleve were ineffective.  He was evaluated by ENT and was diagnosed with Meniere's disease.  Treatment, including low-sodium diet and prednisone, were not effective.  He was then evaluated by neurology in January 2015.  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  There was a concern for tick bites, so he was tested for Lyme, which was  negative.  He was evaluated by another ENT, Dr. Dorma Russell.  ENG from 07/09/13 was overall unremarkable except for showing borderline abnormal optokinetic testing, which may suggest central involvement.  ABR reportedly was abnormal with "absence of wave I AD and wave I-III AS with condensation polarity".  Audiometric testing revealed only mild bilateral high-frequency sensorineural hearing loss.  Meniere's was ruled out, so he was referred to Dr. Clarisse Gouge, a headache specialist.  MRI of the brain with and without contrast again showed the benign venous anomaly, but nothing concerning.  MRA of the head showed fetal origin of the left PCA, but no vertebrobasilar insufficiency.  Dr. Clarisse Gouge did not suspect migraine, however.  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  EKG showed normal sinus rhythm of 79 bpm with PR interval of 164 ms and QT/QTc of 374/428.  It was stopped due to tinnitus.  After discontinuing the verapamil, the tinnitus persisted, so verapamil was restarted.    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  His sister has Meniere's disease.  His mother may have had migraines.  His father had a heart attack at age 11.  PAST MEDICAL HISTORY: Past Medical History  Diagnosis Date  . IBS (irritable bowel syndrome)   . Headache     MEDICATIONS: No current outpatient  prescriptions on file prior to visit.   No current facility-administered medications on file prior to visit.    ALLERGIES: No Known Allergies  FAMILY HISTORY: Family History  Problem Relation Age of Onset  . Heart attack Father 3240  . Heart failure Paternal Grandmother     SOCIAL HISTORY: History   Social History  . Marital Status: Married    Spouse Name: [name]  . Number of Children: 1  . Years of Education: 12th   Occupational History  .  Other    Long Term Disability    Social History Main Topics  . Smoking status: Never Smoker   . Smokeless tobacco: Current User     Comment: occasionally  . Alcohol Use: 0.0 oz/week    0 Standard drinks or equivalent per week     Comment: beer 3 or more weekly  . Drug Use: No  . Sexual Activity:    Partners: Female   Other Topics Concern  . Not on file   Social History Narrative   Patient lives at home with family.   Caffeine Use: none; quit 2 months ago    REVIEW OF SYSTEMS: Constitutional: No fevers, chills, or sweats, no generalized fatigue, change in appetite Eyes: No visual changes, double vision, eye pain Ear, nose and throat: No hearing loss, ear pain, nasal congestion, sore throat Cardiovascular: No chest pain, palpitations Respiratory:  No shortness of breath at rest or with exertion, wheezes GastrointestinaI: No nausea, vomiting, diarrhea, abdominal pain, fecal incontinence Genitourinary:  No dysuria, urinary retention or frequency Musculoskeletal:  No neck pain, back pain Integumentary: No rash, pruritus, skin lesions Neurological: as above Psychiatric: No depression, insomnia, anxiety Endocrine: No palpitations, fatigue, diaphoresis, mood swings, change in appetite, change in weight, increased thirst Hematologic/Lymphatic:  No anemia, purpura, petechiae. Allergic/Immunologic: no itchy/runny eyes, nasal congestion, recent allergic reactions, rashes  PHYSICAL EXAM: Filed Vitals:   07/27/14 1034  BP: 118/70  Pulse: 80   General: Anxious Head:  Normocephalic/atraumatic Eyes:  Fundoscopic exam unremarkable without vessel changes, exudates, hemorrhages or papilledema. Neck: supple, no paraspinal tenderness, full range of motion Heart:  Regular rate and rhythm Lungs:  Clear to auscultation bilaterally Back: No paraspinal tenderness Neurological Exam: alert and oriented to person, place, and time. Attention span and concentration intact, recent and remote memory intact, fund of knowledge  intact.  Speech fluent and not dysarthric, language intact.  CN II-XII intact. Fundoscopic exam unremarkable without vessel changes, exudates, hemorrhages or papilledema.  Bulk and tone normal, muscle strength 5/5 throughout.  Sensation to light touch, temperature and vibration intact.  Deep tendon reflexes 2+ throughout, toes downgoing.  Finger to nose and heel to shin testing intact.  Gait normal, able to tandem walk, Romberg negative.  IMPRESSION: Dizziness  Tinnitus Anxiety headache  These current symptoms do not seem migrainous and may be related to anxiety.  I have no neurologic explanation for his symptoms.  I think the best thing would be to see a therapist, probably cognitive behavioral therapy.  Other options would be to try one more medication, such as nortriptyline, or vestibular rehab.  He would like to try nortriptyline first, since it may address his anxiety as well.  PLAN: Nortriptyline 25mg  at bedtime.  He is to call in 4 weeks with update Stop verapamil Follow up in 2 months.  15 minutes spent with patient face to face, over 50% spent discussing diagnosis and plan.  Shon MilletAdam Tuwanda Vokes, DO  CC: Joycelyn RuaStephen Meyers

## 2014-07-27 NOTE — Telephone Encounter (Signed)
I placed prescription for amitriptyline 25mg  at bedtime.

## 2014-07-27 NOTE — Telephone Encounter (Signed)
Pt called, wants to try Effexor instead of Nortriptyline.  Pt CB# [phone] / Sherri S.

## 2014-09-14 ENCOUNTER — Telehealth: Payer: Self-pay | Admitting: Neurology

## 2014-09-14 NOTE — Telephone Encounter (Signed)
Patient is taking amitriptyline  1 po hs he is still having a slight headache with some light headed feeling he is wondering does he need to increase the dose ? Please advise

## 2014-09-14 NOTE — Telephone Encounter (Signed)
Can increase dose of Amitriptyline  to  daily, monitor for drowsiness with increase in dose. Thanks

## 2014-09-14 NOTE — Telephone Encounter (Signed)
Pt called concerning his med for migraines(didn't know name of med) feeing dizzy and light headed/call back @ [phone]

## 2014-09-14 NOTE — Telephone Encounter (Signed)
Can increase dose to  daily, monitor for drowsiness with increase in dose. Thanks

## 2014-11-11 ENCOUNTER — Ambulatory Visit: Payer: BLUE CROSS/BLUE SHIELD | Admitting: Neurology

## 2014-12-05 ENCOUNTER — Ambulatory Visit: Payer: BLUE CROSS/BLUE SHIELD | Admitting: Neurology
